# Patient Record
Sex: Female | Born: 1960 | Race: Black or African American | Hispanic: No | Marital: Single | Smoking: Current every day smoker
Health system: Southern US, Community
[De-identification: ages and names within clinical notes are randomized; demographics above are authoritative.]

## PROBLEM LIST (undated history)

## (undated) DIAGNOSIS — K769 Liver disease, unspecified: Secondary | ICD-10-CM

## (undated) DIAGNOSIS — J40 Bronchitis, not specified as acute or chronic: Secondary | ICD-10-CM

---

## 2013-03-18 ENCOUNTER — Encounter (HOSPITAL_COMMUNITY): Payer: Self-pay | Admitting: *Deleted

## 2013-03-18 ENCOUNTER — Emergency Department (HOSPITAL_COMMUNITY): Payer: Self-pay

## 2013-03-18 ENCOUNTER — Emergency Department (HOSPITAL_COMMUNITY)
Admission: EM | Admit: 2013-03-18 | Discharge: 2013-03-18 | Disposition: A | Discharge status: Home / Self Care | Payer: Self-pay | Attending: Emergency Medicine | Admitting: Emergency Medicine

## 2013-03-18 DIAGNOSIS — R0602 Shortness of breath: Secondary | ICD-10-CM | POA: Insufficient documentation

## 2013-03-18 DIAGNOSIS — J3489 Other specified disorders of nose and nasal sinuses: Secondary | ICD-10-CM | POA: Insufficient documentation

## 2013-03-18 DIAGNOSIS — J209 Acute bronchitis, unspecified: Secondary | ICD-10-CM

## 2013-03-18 DIAGNOSIS — F17200 Nicotine dependence, unspecified, uncomplicated: Secondary | ICD-10-CM | POA: Insufficient documentation

## 2013-03-18 DIAGNOSIS — R7989 Other specified abnormal findings of blood chemistry: Secondary | ICD-10-CM

## 2013-03-18 DIAGNOSIS — R0789 Other chest pain: Secondary | ICD-10-CM

## 2013-03-18 LAB — CBC WITH DIFFERENTIAL/PLATELET
Basophils Absolute: 0 10*3/uL (ref 0.0–0.1)
Basophils Relative: 0 % (ref 0–1)
Eosinophils Absolute: 0 10*3/uL (ref 0.0–0.7)
Hemoglobin: 14.4 g/dL (ref 12.0–15.0)
MCH: 35.8 pg — ABNORMAL HIGH (ref 26.0–34.0)
MCHC: 36.7 g/dL — ABNORMAL HIGH (ref 30.0–36.0)
Monocytes Relative: 13 % — ABNORMAL HIGH (ref 3–12)
Neutro Abs: 3.8 10*3/uL (ref 1.7–7.7)
Neutrophils Relative %: 52 % (ref 43–77)
RDW: 12.7 % (ref 11.5–15.5)

## 2013-03-18 LAB — COMPREHENSIVE METABOLIC PANEL
AST: 56 U/L — ABNORMAL HIGH (ref 0–37)
Albumin: 3.5 g/dL (ref 3.5–5.2)
Alkaline Phosphatase: 139 U/L — ABNORMAL HIGH (ref 39–117)
BUN: 9 mg/dL (ref 6–23)
Creatinine, Ser: 1 mg/dL (ref 0.50–1.10)
Potassium: 3.8 mEq/L (ref 3.5–5.1)
Total Protein: 7.5 g/dL (ref 6.0–8.3)

## 2013-03-18 LAB — TROPONIN I: Troponin I: <0.3 ng/mL (ref <0.30)

## 2013-03-18 MED ORDER — IPRATROPIUM BROMIDE 0.02 % IN SOLN
0.5000 mg | Freq: Once | RESPIRATORY_TRACT | Status: AC
  Administered 2013-03-18: 0.5 mg via RESPIRATORY_TRACT
  Filled 2013-03-18: qty 2.5

## 2013-03-18 MED ORDER — AZITHROMYCIN 250 MG PO TABS: 250.0000 mg | ORAL_TABLET | Freq: Every day | ORAL | Status: AC

## 2013-03-18 MED ORDER — HYDROCOD POLST-CHLORPHEN POLST 10-8 MG/5ML PO LQCR: 5.0000 mL | Freq: Two times a day (BID) | ORAL | Status: AC | PRN

## 2013-03-18 MED ORDER — ALBUTEROL SULFATE (5 MG/ML) 0.5% IN NEBU
5.0000 mg | INHALATION_SOLUTION | Freq: Once | RESPIRATORY_TRACT | Status: AC
  Administered 2013-03-18: 5 mg via RESPIRATORY_TRACT
  Filled 2013-03-18: qty 1

## 2013-03-18 NOTE — ED Notes (Signed)
Reports having sob and mid chest heaviness x 2-3 days but it has gotten worse. Having congestion, denies coughing, also has body aches and headache. spo2 95% at triage, able to speak in full sentences.

## 2013-03-18 NOTE — ED Notes (Signed)
Patient is alert and orientedx4.  Patient was explained discharge instructions and they understood them with no questions.  Haley Sawyer, her niece, is taking the patient home.

## 2013-03-18 NOTE — ED Notes (Signed)
Family at bedside. 

## 2013-03-18 NOTE — ED Provider Notes (Signed)
History     CSN: [id number]  Arrival date & time 03/18/13  1842   First MD Initiated Contact with Patient 03/18/13 1933      Chief Complaint  Patient presents with  . Chest Pain  . Shortness of Breath    (Consider location/radiation/quality/duration/timing/severity/associated sxs/prior treatment) HPI Comments: Patient presents with complaints of chest and sinus congestion and cough for the past 2-3 days.  She feels tight in the chest, but denies productive cough or fever.  She smokes 1/2 ppd.  No exertional symptoms.    Patient is a 52 y.o. female presenting with chest pain and shortness of breath. The history is provided by the patient.  Chest Pain Pain location:  Substernal area Pain quality: tightness   Pain radiates to:  Does not radiate Pain radiates to the back: no   Pain severity:  Moderate Onset quality:  Gradual Duration:  24 hours Timing:  Constant Progression:  Unchanged Chronicity:  New Context: breathing   Relieved by:  Nothing Worsened by:  Nothing tried Ineffective treatments:  None tried Associated symptoms: shortness of breath   Shortness of Breath Associated symptoms: chest pain     History reviewed. No pertinent past medical history.  History reviewed. No pertinent past surgical history.  History reviewed. No pertinent family history.  History  Substance Use Topics  . Smoking status: Current Every Day Smoker    Types: Cigarettes  . Smokeless tobacco: Not on file  . Alcohol Use: Yes     Comment: occ    OB History   Grav Para Term Preterm Abortions TAB SAB Ect Mult Living                  Review of Systems  Respiratory: Positive for shortness of breath.   Cardiovascular: Positive for chest pain.  All other systems reviewed and are negative.    Allergies  Review of patient's allergies indicates no known allergies.  Home Medications   Current Outpatient Rx  Name  Route  Sig  Dispense  Refill  . diphenhydrAMINE (BENADRYL) 25 mg  capsule   Oral   Take 25 mg by mouth every 6 (six) hours as needed for allergies.         Marland Kitchen OVER THE COUNTER MEDICATION   Oral   Take 1 tablet by mouth daily as needed (for cough/cold).           BP 106/75  Pulse 80  Temp(Src) 98 F (36.7 C) (Oral)  Resp 18  SpO2 100%  Physical Exam  Nursing note and vitals reviewed. Constitutional: She is oriented to person, place, and time. She appears well-developed and well-nourished. No distress.  HENT:  Head: Normocephalic and atraumatic.  Neck: Normal range of motion. Neck supple.  Cardiovascular: Normal rate and regular rhythm.  Exam reveals no gallop and no friction rub.   No murmur heard. Pulmonary/Chest: Effort normal and breath sounds normal. No respiratory distress. She has no wheezes.  Abdominal: Soft. Bowel sounds are normal. She exhibits no distension. There is no tenderness.  Musculoskeletal: Normal range of motion.  Neurological: She is alert and oriented to person, place, and time.  Skin: Skin is warm and dry. She is not diaphoretic.    ED Course  Procedures (including critical care time)  Labs Reviewed  CBC WITH DIFFERENTIAL  COMPREHENSIVE METABOLIC PANEL  TROPONIN I   No results found.   No diagnosis found.   Date: 03/18/2013  Rate: 79  Rhythm: normal sinus rhythm  QRS  Axis: normal  Intervals: normal  ST/T Wave abnormalities: normal  Conduction Disutrbances:none  Narrative Interpretation:   Old EKG Reviewed: none available    MDM  The patient presents here with a one week history of chest tightness, cough, sinus congestion.  She has felt somewhat short of breath but cough has been non-productive.  She has no cardiac history and the cardiac component of the workup is unremarkable.  I believe this is pulmonary in nature, likely bronchitis, and I will prescribe an antibiotic and antitussives.  Her lft's were mildly elevated, the significance of which I am unsure.  Her abdomen was re-examined and is  benign and I doubt gallstones.  She was advised of this finding and the need for follow up.  She does not have a pcp so I will give her the information for [facility] at the urgent care clinic.  She is to return as needed if not improving.          Geoffery Lyons, MD 03/18/13 2237

## 2013-03-18 NOTE — ED Notes (Signed)
Patient is resting comfortably. 

## 2013-03-18 NOTE — ED Notes (Signed)
Pt complaining of sob. Pt o2 stats are 100% room air. Pt placed on n/c 2lpm.

## 2013-03-18 NOTE — ED Notes (Signed)
Patient says she has been congested but no coughing.  Patient says today she started having chest pain with SOB, Diaphoresis, nausea (no vomiting), weakness and back pain earlier today.  Patient says she did not wan to come to the hospital but her children wanted her to come and be evaluated.

## 2013-04-27 ENCOUNTER — Ambulatory Visit: Payer: Self-pay | Admitting: Family Medicine

## 2013-04-27 DIAGNOSIS — Z0289 Encounter for other administrative examinations: Secondary | ICD-10-CM

## 2014-04-02 ENCOUNTER — Telehealth: Payer: Self-pay

## 2014-04-02 NOTE — Telephone Encounter (Deleted)
No show.  New appointment scheduled for 04/03/14 @ 3:30pm with Dr. Beverely Lowabori.

## 2014-04-02 NOTE — Telephone Encounter (Signed)
Unable to reach patient.  Invalid number.  NEW PATIENT 

## 2014-04-03 ENCOUNTER — Ambulatory Visit: Payer: Self-pay | Admitting: Family Medicine

## 2014-04-03 DIAGNOSIS — Z0289 Encounter for other administrative examinations: Secondary | ICD-10-CM

## 2014-07-16 ENCOUNTER — Encounter (HOSPITAL_COMMUNITY): Payer: Self-pay | Admitting: Emergency Medicine

## 2014-07-16 ENCOUNTER — Emergency Department (INDEPENDENT_AMBULATORY_CARE_PROVIDER_SITE_OTHER)
Admission: EM | Admit: 2014-07-16 | Discharge: 2014-07-16 | Disposition: A | Discharge status: Home / Self Care | Payer: Self-pay | Source: Home / Self Care

## 2014-07-16 DIAGNOSIS — K5900 Constipation, unspecified: Secondary | ICD-10-CM

## 2014-07-16 DIAGNOSIS — R109 Unspecified abdominal pain: Secondary | ICD-10-CM

## 2014-07-16 HISTORY — DX: Bronchitis, not specified as acute or chronic: J40

## 2014-07-16 HISTORY — DX: Liver disease, unspecified: K76.9

## 2014-07-16 MED ORDER — POLYETHYLENE GLYCOL 3350 17 GM/SCOOP PO POWD: 1.0000 | Freq: Two times a day (BID) | ORAL | Status: AC | PRN

## 2014-07-16 MED ORDER — SENNA 8.6 MG PO TABS
1.0000 | ORAL_TABLET | Freq: Every day | ORAL | Status: AC
Start: 2014-07-16 — End: ?

## 2014-07-16 NOTE — Discharge Instructions (Signed)
The cause of your abdominal pain is not clear. It is likely due to chronic constipation Please drink lots of fluids, decrease fried foods, and start the miralax and senokot to have daily bowel movements.  There is no evidence of significant abdominal condition to be concerned about tonight.  Please try to get in with a regular doctor to further evaluate your liver.

## 2014-07-16 NOTE — ED Notes (Signed)
Pt  Has multiple  Complaints       To   Include    Pain  And  puffyness  r  Eye        With  No  Apparent  Injury    She  Also reports   generalised  abd  Pain  She   States  She  Was  Told she  Had  Elevated liver  Enzymes    Last  Year but  Was  Never  Followed   Up  She  Also  Reports  r  Leg  Pain    But  denys  Any injury      She  Ambulated  Top  Room  With a  Slow  Steady  Upright  Gait

## 2014-07-16 NOTE — ED Provider Notes (Signed)
CSN: [id number]     Arrival date & time 07/16/14  1608 History   None    Chief Complaint  Patient presents with  . Abdominal Pain   (Consider location/radiation/quality/duration/timing/severity/associated sxs/prior Treatment) HPI  Abd pain: worse in the mornings. Ongoing for >8123yr. Comes and goes. Achy. Lasts for several minutes. Goes away w/o interventions. Associated w/ nausea. Every morning for the past year. 2BM wkly. Soft and non-bloody. Located mid abdomen. No alleviating factors. Denies fevers, melena, dysuria, frequency, rash. .    Past Medical History  Diagnosis Date  . Liver disorder   . Bronchitis    History reviewed. No pertinent past surgical history. History reviewed. No pertinent family history. History  Substance Use Topics  . Smoking status: Current Every Day Smoker -- 0.00 packs/day    Types: Cigarettes  . Smokeless tobacco: Not on file  . Alcohol Use: Yes     Comment: occ   OB History   Grav Para Term Preterm Abortions TAB SAB Ect Mult Living                 Review of Systems Per HPI with all other pertinent systems negative.   Allergies  Review of patient's allergies indicates no known allergies.  Home Medications   Prior to Admission medications   Medication Sig Start Date End Date Taking? Authorizing Provider  azithromycin (ZITHROMAX) 250 MG tablet Take 1 tablet (250 mg total) by mouth daily. Take first 2 tablets together, then 1 every day until finished. 03/18/13   Geoffery Lyonsouglas Delo, MD  chlorpheniramine-HYDROcodone (TUSSIONEX PENNKINETIC ER) 10-8 MG/5ML LQCR Take 5 mLs by mouth every 12 (twelve) hours as needed. 03/18/13   Geoffery Lyonsouglas Delo, MD  diphenhydrAMINE (BENADRYL) 25 mg capsule Take 25 mg by mouth every 6 (six) hours as needed for allergies.    Historical Provider, MD  OVER THE COUNTER MEDICATION Take 1 tablet by mouth daily as needed (for cough/cold).    Historical Provider, MD  polyethylene glycol powder (GLYCOLAX/MIRALAX) powder Take 255 g (1  Container total) by mouth 2 (two) times daily as needed. 07/16/14   Ozella Rocksavid J Tijah Hane, MD  senna (SENOKOT) 8.6 MG TABS tablet Take 1 tablet (8.6 mg total) by mouth daily. 07/16/14   Ozella Rocksavid J Ronya Gilcrest, MD   BP 126/72  Pulse 75  Temp(Src) 98.6 F (37 C) (Oral)  Resp 20  SpO2 98% Physical Exam  Constitutional: She appears well-developed and well-nourished. No distress.  HENT:  Head: Normocephalic and atraumatic.  Eyes: EOM are normal. Pupils are equal, round, and reactive to light.  Neck: Normal range of motion.  Cardiovascular: Normal rate, regular rhythm, normal heart sounds and intact distal pulses.  Exam reveals no gallop and no friction rub.   No murmur heard. Pulmonary/Chest: Effort normal and breath sounds normal. No respiratory distress.  Abdominal: Soft. Bowel sounds are normal. She exhibits no distension and no mass. There is no tenderness. There is no rebound and no guarding.  Musculoskeletal: Normal range of motion. She exhibits no edema and no tenderness.  Neurological: She is alert. She exhibits normal muscle tone.  Skin: Skin is warm and dry. No rash noted. She is not diaphoretic. No erythema. No pallor.  Psychiatric:  Somewhat of an odd affect.    ED Course  Procedures (including critical care time) Labs Review Labs Reviewed - No data to display  Imaging Review No results found.   MDM   1. Constipation, unspecified constipation type   2. Abdominal pain, unspecified abdominal location  Chronic intermittent condition for pt. Likely related to constipation. No evidence of acute abd process. Pt to seek permenant medical f/u w/ new pcp.  - increase fluid intake  - decrease fried and processed foods - start miralax and senokot.  - Precautions given and all questions answered   Shelly Flatten, MD Family Medicine 07/16/2014, 5:29 PM      Ozella Rocks, MD 07/16/14 [phone]

## 2015-07-17 ENCOUNTER — Other Ambulatory Visit (HOSPITAL_COMMUNITY): Payer: Self-pay | Admitting: Nurse Practitioner

## 2015-07-17 DIAGNOSIS — B182 Chronic viral hepatitis C: Secondary | ICD-10-CM

## 2015-07-22 ENCOUNTER — Other Ambulatory Visit: Payer: Self-pay

## 2015-07-22 DIAGNOSIS — Z1231 Encounter for screening mammogram for malignant neoplasm of breast: Secondary | ICD-10-CM

## 2015-07-24 ENCOUNTER — Ambulatory Visit
Admission: RE | Admit: 2015-07-24 | Discharge: 2015-07-24 | Disposition: A | Discharge status: Home / Self Care | Payer: Medicare Other | Source: Ambulatory Visit

## 2015-07-24 DIAGNOSIS — Z1231 Encounter for screening mammogram for malignant neoplasm of breast: Secondary | ICD-10-CM

## 2015-07-28 ENCOUNTER — Other Ambulatory Visit: Payer: Self-pay | Admitting: Nurse Practitioner

## 2015-07-28 ENCOUNTER — Other Ambulatory Visit: Payer: Self-pay | Admitting: Internal Medicine

## 2015-07-28 DIAGNOSIS — K7469 Other cirrhosis of liver: Secondary | ICD-10-CM

## 2015-07-28 DIAGNOSIS — R772 Abnormality of alphafetoprotein: Secondary | ICD-10-CM

## 2015-07-28 DIAGNOSIS — R928 Other abnormal and inconclusive findings on diagnostic imaging of breast: Secondary | ICD-10-CM

## 2015-08-01 ENCOUNTER — Other Ambulatory Visit: Payer: Self-pay | Admitting: Internal Medicine

## 2015-08-01 ENCOUNTER — Ambulatory Visit
Admission: RE | Admit: 2015-08-01 | Discharge: 2015-08-01 | Disposition: A | Discharge status: Home / Self Care | Payer: Medicare Other | Source: Ambulatory Visit | Attending: Internal Medicine | Admitting: Internal Medicine

## 2015-08-01 DIAGNOSIS — R921 Mammographic calcification found on diagnostic imaging of breast: Secondary | ICD-10-CM

## 2015-08-01 DIAGNOSIS — R928 Other abnormal and inconclusive findings on diagnostic imaging of breast: Secondary | ICD-10-CM

## 2015-08-05 ENCOUNTER — Ambulatory Visit
Admission: RE | Admit: 2015-08-05 | Discharge: 2015-08-05 | Disposition: A | Discharge status: Home / Self Care | Payer: Medicare Other | Source: Ambulatory Visit | Attending: Nurse Practitioner | Admitting: Nurse Practitioner

## 2015-08-05 DIAGNOSIS — K7469 Other cirrhosis of liver: Secondary | ICD-10-CM

## 2015-08-05 DIAGNOSIS — R772 Abnormality of alphafetoprotein: Secondary | ICD-10-CM

## 2015-08-05 MED ORDER — GADOXETATE DISODIUM 0.25 MMOL/ML IV SOLN
5.0000 mL | Freq: Once | INTRAVENOUS | Status: AC | PRN
  Administered 2015-08-05: 5 mL via INTRAVENOUS

## 2015-08-06 ENCOUNTER — Ambulatory Visit
Admission: RE | Admit: 2015-08-06 | Discharge: 2015-08-06 | Disposition: A | Discharge status: Home / Self Care | Payer: Medicare Other | Source: Ambulatory Visit | Attending: Internal Medicine | Admitting: Internal Medicine

## 2015-08-06 DIAGNOSIS — R921 Mammographic calcification found on diagnostic imaging of breast: Secondary | ICD-10-CM

## 2015-08-19 ENCOUNTER — Ambulatory Visit (HOSPITAL_COMMUNITY): Payer: Medicare Other

## 2016-01-20 ENCOUNTER — Other Ambulatory Visit: Payer: Self-pay | Admitting: Nurse Practitioner

## 2016-01-20 DIAGNOSIS — K769 Liver disease, unspecified: Secondary | ICD-10-CM

## 2016-01-27 ENCOUNTER — Ambulatory Visit
Admission: RE | Admit: 2016-01-27 | Discharge: 2016-01-27 | Disposition: A | Discharge status: Home / Self Care | Payer: Medicare Other | Source: Ambulatory Visit | Attending: Nurse Practitioner | Admitting: Nurse Practitioner

## 2016-01-27 DIAGNOSIS — K769 Liver disease, unspecified: Secondary | ICD-10-CM

## 2016-01-27 MED ORDER — GADOXETATE DISODIUM 0.25 MMOL/ML IV SOLN
5.0000 mL | Freq: Once | INTRAVENOUS | Status: AC | PRN
  Administered 2016-01-27: 5 mL via INTRAVENOUS

## 2018-04-13 ENCOUNTER — Emergency Department (HOSPITAL_COMMUNITY): Payer: No Typology Code available for payment source

## 2018-04-13 ENCOUNTER — Emergency Department (HOSPITAL_COMMUNITY)
Admission: EM | Admit: 2018-04-13 | Discharge: 2018-04-13 | Disposition: A | Discharge status: Home / Self Care | Payer: No Typology Code available for payment source | Attending: Emergency Medicine | Admitting: Emergency Medicine

## 2018-04-13 ENCOUNTER — Other Ambulatory Visit: Payer: Self-pay

## 2018-04-13 DIAGNOSIS — Y999 Unspecified external cause status: Secondary | ICD-10-CM | POA: Insufficient documentation

## 2018-04-13 DIAGNOSIS — Y92410 Unspecified street and highway as the place of occurrence of the external cause: Secondary | ICD-10-CM | POA: Diagnosis not present

## 2018-04-13 DIAGNOSIS — R0789 Other chest pain: Secondary | ICD-10-CM | POA: Diagnosis not present

## 2018-04-13 DIAGNOSIS — Y9389 Activity, other specified: Secondary | ICD-10-CM | POA: Insufficient documentation

## 2018-04-13 DIAGNOSIS — M2550 Pain in unspecified joint: Secondary | ICD-10-CM | POA: Diagnosis not present

## 2018-04-13 DIAGNOSIS — M25511 Pain in right shoulder: Secondary | ICD-10-CM | POA: Diagnosis not present

## 2018-04-13 DIAGNOSIS — T148XXA Other injury of unspecified body region, initial encounter: Secondary | ICD-10-CM | POA: Diagnosis present

## 2018-04-13 DIAGNOSIS — F17210 Nicotine dependence, cigarettes, uncomplicated: Secondary | ICD-10-CM | POA: Diagnosis not present

## 2018-04-13 DIAGNOSIS — M25551 Pain in right hip: Secondary | ICD-10-CM | POA: Diagnosis not present

## 2018-04-13 DIAGNOSIS — Z79899 Other long term (current) drug therapy: Secondary | ICD-10-CM | POA: Insufficient documentation

## 2018-04-13 DIAGNOSIS — M542 Cervicalgia: Secondary | ICD-10-CM | POA: Diagnosis not present

## 2018-04-13 DIAGNOSIS — M549 Dorsalgia, unspecified: Secondary | ICD-10-CM | POA: Diagnosis not present

## 2018-04-13 DIAGNOSIS — M25512 Pain in left shoulder: Secondary | ICD-10-CM | POA: Diagnosis not present

## 2018-04-13 MED ORDER — CYCLOBENZAPRINE HCL 10 MG PO TABS: 10.0000 mg | ORAL_TABLET | Freq: Three times a day (TID) | ORAL | 0 refills | Status: AC | PRN

## 2018-04-13 MED ORDER — HYDROCODONE-ACETAMINOPHEN 5-325 MG PO TABS
2.0000 | ORAL_TABLET | Freq: Once | ORAL | Status: AC
  Administered 2018-04-13: 2 via ORAL
  Filled 2018-04-13: qty 2

## 2018-04-13 MED ORDER — NAPROXEN 375 MG PO TABS: 375.0000 mg | ORAL_TABLET | Freq: Two times a day (BID) | ORAL | 0 refills | Status: AC | PRN

## 2018-04-13 NOTE — ED Notes (Signed)
Sandwich and drink given to pt in hallway
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
## 2018-04-13 NOTE — ED Notes (Signed)
Dr. Erma HeritageIsaacs at bedside, c-collar removed

## 2018-04-13 NOTE — ED Notes (Signed)
Pt stable, ambulatory, states understanding of discharge instructions 
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
